# Patient Record
Sex: Male | Born: 1990 | Race: White | Hispanic: No | Marital: Single | Smoking: Current every day smoker
Health system: Southern US, Community
[De-identification: ages and names within clinical notes are randomized; demographics above are authoritative.]

---
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
## 2014-01-08 ENCOUNTER — Ambulatory Visit (INDEPENDENT_AMBULATORY_CARE_PROVIDER_SITE_OTHER): Payer: PRIVATE HEALTH INSURANCE | Admitting: Internal Medicine

## 2014-01-08 VITALS — BP 124/82 | HR 67 | Temp 98.0°F | Resp 16 | Ht 68.75 in | Wt 152.2 lb

## 2014-01-08 DIAGNOSIS — H05229 Edema of unspecified orbit: Secondary | ICD-10-CM

## 2014-01-08 DIAGNOSIS — L237 Allergic contact dermatitis due to plants, except food: Secondary | ICD-10-CM

## 2014-01-08 DIAGNOSIS — H578 Other specified disorders of eye and adnexa: Secondary | ICD-10-CM

## 2014-01-08 MED ORDER — HYDROXYZINE HCL 25 MG PO TABS: 12.5000 mg | ORAL_TABLET | Freq: Three times a day (TID) | ORAL | Status: DC | PRN

## 2014-01-08 MED ORDER — METHYLPREDNISOLONE SODIUM SUCC 125 MG IJ SOLR
62.5000 mg | Freq: Once | INTRAMUSCULAR | Status: AC
  Administered 2014-01-08: 62.5 mg via INTRAMUSCULAR

## 2014-01-08 MED ORDER — PREDNISONE 20 MG PO TABS: ORAL_TABLET | ORAL | Status: DC

## 2014-01-08 NOTE — Progress Notes (Signed)
IDENTIFYING INFORMATION  Patrick Harrington / DOB: May 06, 1990 / MRN: [id number]  The patient  does not have a problem list on file.  SUBJECTIVE  CC: Poison Ivy   HPI: Patrick Harrington is a 24 y.o. y.o. male presenting for a skin rash around his left eye that started on 3 nights ago.  He was duck hunting Saturday and reports a poison ivy exposure at that time. He reports left orbital swelling and severe pruritis at this time. He feels it is getting worse, given that he had little swelling last night. He has applied calamine lotion which helps with the itching.  He however denies eye pain, difficulty moving the eye and changes in vision. He has tried  He has a history of poison ivy allergy, to which he reports "hundreds" of exposures.  He only reports one other episode in which his eyes were involved, at which time he required a IM solumedrol along with a medrol dose pack.   He denies a history of diabetes, HTN, PUD, and gastritis.   He  has no past medical history on file.    He currently has no medications in their medication list.  Patrick Harrington has No Known Allergies. He  reports that he has been smoking.  He does not have any smokeless tobacco history on file. He reports that he drinks alcohol. He reports that he does not use illicit drugs. He  has no sexual activity history on file.  The patient  has no past surgical history on file.  His family history is not on file.  Review of Systems  Respiratory: Negative for shortness of breath and wheezing.   Cardiovascular: Negative for palpitations.  Gastrointestinal: Negative for heartburn and abdominal pain.  Genitourinary: Negative.   Musculoskeletal: Negative.   Skin: Positive for itching and rash.  Neurological: Negative for dizziness, sensory change and headaches.    OBJECTIVE  Blood pressure 124/82, pulse 67, temperature 98 F (36.7 C), temperature source Oral, resp. rate 16, height 5' 8.75" (1.746 m), weight 152 lb 3.2 oz (69.037 kg),  SpO2 99 %. The patient's body mass index is 22.65 kg/(m^2).  Physical Exam  Constitutional: He is oriented to person, place, and time. He appears well-developed and well-nourished. No distress.  HENT:  Head: Normocephalic.  Right Ear: External ear normal.  Left Ear: External ear normal.  Nose: Nose normal.  Mouth/Throat: Oropharynx is clear and moist. No oropharyngeal exudate.  Eyes: Conjunctivae and EOM are normal. Pupils are equal, round, and reactive to light.    Respiratory: Effort normal.  Neurological: He is alert and oriented to person, place, and time.  Skin: Skin is warm. Rash noted. He is not diaphoretic. There is erythema.  Psychiatric: He has a normal mood and affect.    Visual Acuity Screening   Right eye Left eye Both eyes  Without correction:     With correction: 20/20 20/25 -1 20/15 -1     No results found for this or any previous visit (from the past 24 hour(s)).  ASSESSMENT & PLAN  Patrick was seen today for poison ivy.  Diagnoses and associated orders for this visit:  Poison ivy - methylPREDNISolone sodium succinate (SOLU-MEDROL) 125 mg/2 mL injection 62.5 mg; Inject 1 mL (62.5 mg total) into the muscle once. - hydrOXYzine (ATARAX/VISTARIL) 25 MG tablet; Take 0.5-1 tablets (12.5-25 mg total) by mouth every 8 (eight) hours as needed for anxiety or itching. - predniSONE (DELTASONE) 20 MG tablet; Take 3 pills for 4  days, then 2 pills for 4 days, and 1 pill for 4 days.  (Starting tomorrow per pharm rec)  Orbital swelling - predniSONE (DELTASONE) 20 MG tablet; Take 3 pills for 4 days, then 2 pills for 4 days, and 1 pill for 4 days. (Starting tomorrow per pharm rec) -     Pt advised to call or go to the ED is his swelling becomes worse or his vision             become impaired.       The patient was instructed to to call or comeback to clinic as needed, or should symptoms warrant.  Deliah BostonMichael Clark, MHS, PA-C [facility] 01/08/2014 10:34 AM

## 2016-08-04 ENCOUNTER — Ambulatory Visit (INDEPENDENT_AMBULATORY_CARE_PROVIDER_SITE_OTHER): Payer: BLUE CROSS/BLUE SHIELD | Admitting: Internal Medicine

## 2016-08-04 ENCOUNTER — Encounter: Payer: Self-pay | Admitting: Internal Medicine

## 2016-08-04 DIAGNOSIS — M545 Low back pain, unspecified: Secondary | ICD-10-CM

## 2016-08-04 DIAGNOSIS — M25562 Pain in left knee: Secondary | ICD-10-CM | POA: Diagnosis not present

## 2016-08-04 DIAGNOSIS — M25561 Pain in right knee: Secondary | ICD-10-CM

## 2016-08-04 MED ORDER — MELOXICAM 15 MG PO TABS: 15.0000 mg | ORAL_TABLET | Freq: Every day | ORAL | 0 refills | Status: DC

## 2016-08-04 MED ORDER — CYCLOBENZAPRINE HCL 10 MG PO TABS: 10.0000 mg | ORAL_TABLET | Freq: Every day | ORAL | 0 refills | Status: DC
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
## 2016-08-05 NOTE — Patient Instructions (Signed)
Take mobile 15 mg daily, take Flexeril 10 mg one half tablet at bedtime. Apply ice to knees 20 minutes twice daily. Return in 2 weeks. Have x-rays of LS-spine and both knees.

## 2016-08-09 ENCOUNTER — Other Ambulatory Visit: Payer: Self-pay | Admitting: Internal Medicine

## 2016-08-09 ENCOUNTER — Ambulatory Visit
Admission: RE | Admit: 2016-08-09 | Discharge: 2016-08-09 | Disposition: A | Discharge status: Home / Self Care | Payer: BLUE CROSS/BLUE SHIELD | Source: Ambulatory Visit | Attending: Internal Medicine | Admitting: Internal Medicine

## 2016-08-09 DIAGNOSIS — M545 Low back pain, unspecified: Secondary | ICD-10-CM

## 2016-08-09 DIAGNOSIS — M25562 Pain in left knee: Secondary | ICD-10-CM

## 2016-08-09 DIAGNOSIS — M25561 Pain in right knee: Secondary | ICD-10-CM

## 2016-08-19 ENCOUNTER — Encounter: Payer: Self-pay | Admitting: Internal Medicine

## 2016-08-19 ENCOUNTER — Ambulatory Visit (INDEPENDENT_AMBULATORY_CARE_PROVIDER_SITE_OTHER): Payer: BLUE CROSS/BLUE SHIELD | Admitting: Internal Medicine

## 2016-08-19 DIAGNOSIS — M545 Low back pain, unspecified: Secondary | ICD-10-CM

## 2016-08-19 DIAGNOSIS — M25561 Pain in right knee: Secondary | ICD-10-CM

## 2016-08-19 DIAGNOSIS — M25562 Pain in left knee: Secondary | ICD-10-CM | POA: Diagnosis not present

## 2016-08-19 MED ORDER — CYCLOBENZAPRINE HCL 10 MG PO TABS: 10.0000 mg | ORAL_TABLET | Freq: Three times a day (TID) | ORAL | 0 refills | Status: DC | PRN
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
## 2016-08-22 NOTE — Patient Instructions (Signed)
Proceed with physical therapy for persistent low back pain and bilateral knee pain. Follow-up after physical therapy is completed

## 2016-09-22 ENCOUNTER — Ambulatory Visit: Payer: BLUE CROSS/BLUE SHIELD | Attending: Internal Medicine | Admitting: Physical Therapy

## 2016-09-22 DIAGNOSIS — M791 Myalgia, unspecified site: Secondary | ICD-10-CM

## 2016-09-22 DIAGNOSIS — M25562 Pain in left knee: Secondary | ICD-10-CM | POA: Diagnosis present

## 2016-09-22 DIAGNOSIS — M545 Low back pain, unspecified: Secondary | ICD-10-CM

## 2016-09-22 DIAGNOSIS — M25561 Pain in right knee: Secondary | ICD-10-CM | POA: Diagnosis present

## 2016-09-22 NOTE — Patient Instructions (Addendum)
Quadratus Stretch    Sit in a chair. Roll ball out and to the left to stretch right side.  You can also do this in a doorway.  Hold 30 seconds. Repeat __2-3__ times per set. Do ___1_ sets per session. Do ___2-3_ sessions per day.   Roll with ball into wall for additional myofascial release.      Trigger Point Dry Needling  . What is Trigger Point Dry Needling (DN)? o DN is a physical therapy technique used to treat muscle pain and dysfunction. Specifically, DN helps deactivate muscle trigger points (muscle knots).  o A thin filiform needle is used to penetrate the skin and stimulate the underlying trigger point. The goal is for a local twitch response (LTR) to occur and for the trigger point to relax. No medication of any kind is injected during the procedure.   . What Does Trigger Point Dry Needling Feel Like?  o The procedure feels different for each individual patient. Some patients report that they do not actually feel the needle enter the skin and overall the process is not painful. Very mild bleeding may occur. However, many patients feel a deep cramping in the muscle in which the needle was inserted. This is the local twitch response.   Marland Kitchen How Will I feel after the treatment? o Soreness is normal, and the onset of soreness may not occur for a few hours. Typically this soreness does not last longer than two days.  o Bruising is uncommon, however; ice can be used to decrease any possible bruising.  o In rare cases feeling tired or nauseous after the treatment is normal. In addition, your symptoms may get worse before they get better, this period will typically not last longer than 24 hours.   . What Can I do After My Treatment? o Increase your hydration by drinking more water for the next 24 hours. o You may place ice or heat on the areas treated that have become sore, however, do not use heat on inflamed or bruised areas. Heat often brings more relief post needling. o You can  continue your regular activities, but vigorous activity is not recommended initially after the treatment for 24 hours. o DN is best combined with other physical therapy such as strengthening, stretching, and other therapies.
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
## 2016-09-29 ENCOUNTER — Ambulatory Visit: Payer: BLUE CROSS/BLUE SHIELD | Admitting: Physical Therapy

## 2016-09-29 ENCOUNTER — Encounter: Payer: Self-pay | Admitting: Physical Therapy

## 2016-09-29 DIAGNOSIS — M545 Low back pain, unspecified: Secondary | ICD-10-CM

## 2016-09-29 DIAGNOSIS — M25562 Pain in left knee: Secondary | ICD-10-CM

## 2016-09-29 DIAGNOSIS — M25561 Pain in right knee: Secondary | ICD-10-CM

## 2016-09-29 DIAGNOSIS — M791 Myalgia, unspecified site: Secondary | ICD-10-CM

## 2016-09-29 NOTE — Therapy (Signed)
[facility] [address] [place], [place], [address] Phone: [phone]   Fax:  [phone]  Physical Therapy Treatment  Patient Details  Name: [place] M Bisson MRN: [id number] Date of Birth: 02-10-1990 Referring Provider: Dr. Marlan Palau  Encounter Date: 09/29/2016      PT End of Session - 09/29/16 1339    Visit Number 2   Number of Visits 12   Date for PT Re-Evaluation 11/03/16   Authorization Type BCBS - 30 visit limit   PT Start Time 1330   PT Stop Time 1410   PT Time Calculation (min) 40 min   Activity Tolerance Patient tolerated treatment well   Behavior During Therapy [facility] for tasks assessed/performed      History reviewed. No pertinent past medical history.  History reviewed. No pertinent surgical history.  There were no vitals filed for this visit.      Subjective Assessment - 09/29/16 1336    Subjective Patient feels like his pain toaday has been better. He has some painsince coming over here on the car ride. He feels like car rides can make it more irritaded.    Limitations Lifting;Sitting;Standing   How long can you sit comfortably? 20 min due to back   How long can you stand comfortably? 20 min due to knee pain   Diagnostic tests xrays negative   Currently in Pain? Yes   Pain Score 3    Pain Location Back   Pain Orientation Lower;Right   Pain Descriptors / Indicators Nagging   Pain Type Acute pain   Pain Onset More than a month ago   Pain Frequency Intermittent   Aggravating Factors  Sitting and driving    Pain Relieving Factors lying flat    Effect of Pain on Daily Activities difficulty driving long distances                          [facility] Adult PT Treatment/Exercise - 09/29/16 0001      Lumbar Exercises: Stretches   Active Hamstring Stretch Limitations 90/90 2x30 sec hold    Lower Trunk Rotation Limitations x10 5 sec hold    Piriformis Stretch Limitations 2x20sec hold      Lumbar Exercises: Supine   Bent Knee Raise Limitations 2x10    Bridge Limitations 2x10    Straight Leg Raises Limitations 2x10     Manual Therapy   Manual Therapy Joint mobilization;Soft tissue mobilization   Joint Mobilization PA glides to L3-L5    Soft tissue mobilization IASTMY to lumbar parasoinals                 PT Education - 09/29/16 1338    Education provided Yes   Education Details reviewed stretching   Person(s) Educated Patient   Methods Explanation;Demonstration;Handout   Comprehension Verbalized understanding;Verbal cues required;Returned demonstration             PT Long Term Goals - 09/22/16 1407      PT LONG TERM GOAL #1   Title independent with HEP   Status New   Target Date 11/03/16     PT LONG TERM GOAL #2   Title perform lumbar ROM without increase in pain for improved function   Status New   Target Date 11/03/16     PT LONG TERM GOAL #3   Title improve FOTO to < 30% limited for improved function   Status New   Target Date 11/03/16  PT LONG TERM GOAL #4   Title report ability to sit or stand for > 45 min without increase in pain for improved job function   Status New   Target Date 11/03/16               Plan - 09/29/16 1341    Clinical Impression Statement Patient tolerated treatment well. No increase in pain noted after treatment. Therapy added light core strengthening exercises and strethcing. Patient had moderate spasming with soft tissue mobilizatio.    Clinical Presentation Stable   Clinical Decision Making Low   Rehab Potential Good   PT Frequency 2x / week   PT Duration 6 weeks   PT Treatment/Interventions ADLs/Self Care Home Management;Electrical Stimulation;Cryotherapy;Iontophoresis /ml Dexamethasone;Moist Heat;Ultrasound;Traction;Neuromuscular re-education;Therapeutic exercise;Therapeutic activities;Functional mobility training;Gait training;Patient/family education;Manual techniques;Taping;Dry needling   PT  Next Visit Plan review tolerance to light core exercises; TPDN if needed   Consulted and Agree with Plan of Care Patient      Patient will benefit from skilled therapeutic intervention in order to improve the following deficits and impairments:  Pain, Decreased strength, Increased muscle spasms, Impaired flexibility, Postural dysfunction  Visit Diagnosis: Acute right-sided low back pain without sciatica  Acute pain of left knee  Acute pain of right knee  Myalgia     Problem List There are no active problems to display for this patient.   [name] PT DPT  09/29/2016, 2:50 PM  [facility] [address] [place], [place], [address] Phone: [phone]   Fax:  [phone]  Name: [place] M Baig MRN: [id number] Date of Birth: Aug 17, 1990

## 2016-10-05 ENCOUNTER — Encounter: Payer: Self-pay | Admitting: Physical Therapy

## 2016-10-05 ENCOUNTER — Ambulatory Visit: Payer: BLUE CROSS/BLUE SHIELD | Attending: Internal Medicine | Admitting: Physical Therapy

## 2016-10-05 DIAGNOSIS — M791 Myalgia, unspecified site: Secondary | ICD-10-CM | POA: Diagnosis present

## 2016-10-05 DIAGNOSIS — M25562 Pain in left knee: Secondary | ICD-10-CM

## 2016-10-05 DIAGNOSIS — M545 Low back pain, unspecified: Secondary | ICD-10-CM

## 2016-10-05 DIAGNOSIS — M25561 Pain in right knee: Secondary | ICD-10-CM | POA: Insufficient documentation

## 2016-10-05 NOTE — Therapy (Signed)
[facility] [address] [place], [place], [address] Phone: [phone]   Fax:  [phone]  Physical Therapy Treatment  Patient Details  Name: Patrick Harrington MRN: [id number] Date of Birth: 1990-03-26 Referring Provider: Dr. Marlan Palau  Encounter Date: 10/05/2016      PT End of Session - 10/05/16 1330    Visit Number 3   Number of Visits 12   Date for PT Re-Evaluation 11/03/16   Authorization Type BCBS - 30 visit limit   PT Start Time 1330   PT Stop Time 1425   PT Time Calculation (min) 55 min      History reviewed. No pertinent past medical history.  History reviewed. No pertinent surgical history.  There were no vitals filed for this visit.      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 20 min due to back   How long can you stand comfortably? 20 min due to knee pain   Diagnostic tests xrays negative   Patient Stated Goals improve pain   Currently in Pain? Yes   Pain Score 6    Pain Location Back   Pain Orientation Right;Lower   Pain Descriptors / Indicators Nagging;Sore   Pain Onset More than a month ago   Pain Frequency Constant            [facility] PT Assessment - 10/05/16 1355      Posture/Postural Control   Posture Comments Pt with elevated right pelvis, spasming in bil QL RIght >LEft                     [facility] Adult PT Treatment/Exercise - 10/05/16 1355      Self-Care   Self-Care Other Self-Care Comments   Other Self-Care Comments  educated on TDN and precautians and aftercare     Exercises   Exercises Lumbar     Lumbar Exercises: Stretches   Active Hamstring Stretch Limitations 90/90 2x30 sec hold    Lower Trunk Rotation Limitations x10 5 sec hold   Pt does tailor sitting to  stetch   Piriformis Stretch Limitations 2x 30sec hold   supine with VC and TC     Lumbar Exercises: Supine   Bent Knee Raise Limitations 2x10   bil   Bridge Limitations 2x10    Straight Leg Raises Limitations 2x10     Lumbar Exercises: Sidelying   Other Sidelying Lumbar Exercises left sidelying QL stretch for 5 minutes      Lumbar Exercises: Quadruped   Other Quadruped Lumbar Exercises mid back stretch x 3 x 30 sec     Manual Therapy   Manual Therapy Joint mobilization;Soft tissue mobilization;Myofascial release   Joint Mobilization PA glides to L3-L5    Soft tissue mobilization IASTMY to lumbar parasoinals    Myofascial Release right Quadratus Lumborum          Trigger Point Dry Needling - 10/05/16 1350    Consent Given? Yes   Education Handout Provided Yes   Muscles Treated Upper Body Quadratus Lumborum;Gluteus minimus;Gluteus maximus;Longissimus  Right TDN only twitch on QL   Muscles Treated Lower Body Gluteus minimus;Gluteus maximus   Longissimus Response Palpable increased muscle length  bil T-8 to L1 erector spinae   Gluteus Maximus Response  Twitch response elicited;Palpable increased muscle length   Gluteus Minimus Response Twitch response elicited;Palpable increased muscle length                   PT Long Term Goals - 10/05/16 1352      PT LONG TERM GOAL #1   Title independent with HEP   Baseline given initial HEP   Time 6   Period Weeks   Status On-going     PT LONG TERM GOAL #2   Title perform lumbar ROM without increase in pain for improved function   Baseline Pt able to perform exercises without pain.  INcreased work load this week exacerbated pain   Time 6   Period Weeks   Status On-going     PT LONG TERM GOAL #3   Title improve FOTO to < 30% limited for improved function   Time 6   Period Weeks   Status On-going     PT LONG TERM GOAL #4   Title report ability to sit or stand for > 45 min without increase in pain for improved job  function   Baseline stand 20 to 30 minutes   Period Weeks   Status On-going               Plan - 10/05/16 1531    Clinical Impression Statement Pt tolerated RX well.  Pt consented to TDN and was closely monitored throughtout RX.  Pt states he was able to do exercises without exacerbating pain.  Pt with palpable lengthening after TDN of Right QL and paraspinals   Rehab Potential Good   PT Frequency 2x / week   PT Duration 6 weeks   PT Treatment/Interventions ADLs/Self Care Home Management;Electrical Stimulation;Cryotherapy;Iontophoresis /ml Dexamethasone;Moist Heat;Ultrasound;Traction;Neuromuscular re-education;Therapeutic exercise;Therapeutic activities;Functional mobility training;Gait training;Patient/family education;Manual techniques;Taping;Dry needling   PT Next Visit Plan review tolerance to light core exercises; Assess TDN   PT Home Exercise Plan basic abd set and core, QL stretches, childs pose   Consulted and Agree with Plan of Care Patient      Patient will benefit from skilled therapeutic intervention in order to improve the following deficits and impairments:  Pain, Decreased strength, Increased muscle spasms, Impaired flexibility, Postural dysfunction  Visit Diagnosis: Acute right-sided low back pain without sciatica  Acute pain of left knee  Acute pain of right knee  Myalgia     Problem List There are no active problems to display for this patient.  Garen Lah, PT Certified Exercise Expert for the Aging Adult  10/05/16 3:34 PM Phone: [phone] Fax: [phone]  [facility] [address] [place], [place], [address] Phone: [phone]   Fax:  [phone]  Name: Patrick Harrington MRN: [id number] Date of Birth: [birth year]

## 2016-10-05 NOTE — Patient Instructions (Addendum)
  Side Twist, Kneeling   Kneel, buttocks on heels. Fold upper body forward from hips. Then reach to each side as far as possible, keeping chest low toward floor. Hold each position _30__ seconds. Repeat __3_ times per session. Do __2_ sessions per day.  To stretch your right quadratus lumborum muscle. PLace hands to the right before sitting on feet.  Copyright  VHI. All rights reserved.    Given handouts for Quadratus lumborum information and left sidelying quadratus stretch as well for 5-10 minutes  Trigger Point Dry Needling  . What is Trigger Point Dry Needling (DN)? o DN is a physical therapy technique used to treat muscle pain and dysfunction. Specifically, DN helps deactivate muscle trigger points (muscle knots).  o A thin filiform needle is used to penetrate the skin and stimulate the underlying trigger point. The goal is for a local twitch response (LTR) to occur and for the trigger point to relax. No medication of any kind is injected during the procedure.   . What Does Trigger Point Dry Needling Feel Like?  o The procedure feels different for each individual patient. Some patients report that they do not actually feel the needle enter the skin and overall the process is not painful. Very mild bleeding may occur. However, many patients feel a deep cramping in the muscle in which the needle was inserted. This is the local twitch response.   Marland Kitchen How Will I feel after the treatment? o Soreness is normal, and the onset of soreness may not occur for a few hours. Typically this soreness does not last longer than two days.  o Bruising is uncommon, however; ice can be used to decrease any possible bruising.  o In rare cases feeling tired or nauseous after the treatment is normal. In addition, your symptoms may get worse before they get better, this period will typically not last longer than 24 hours.   . What Can I do After My Treatment? o Increase your hydration by drinking more water for  the next 24 hours. o You may place ice or heat on the areas treated that have become sore, however, do not use heat on inflamed or bruised areas. Heat often brings more relief post needling. o You can continue your regular activities, but vigorous activity is not recommended initially after the treatment for 24 hours. o DN is best combined with other physical therapy such as strengthening, stretching, and other therapies.   Garen Lah, PT Certified Exercise Expert for the Aging Adult  10/05/16 1:42 PM Phone: [phone] Fax: [phone]

## 2016-10-07 ENCOUNTER — Encounter: Payer: BLUE CROSS/BLUE SHIELD | Admitting: Physical Therapy

## 2016-10-12 ENCOUNTER — Ambulatory Visit: Payer: BLUE CROSS/BLUE SHIELD | Admitting: Physical Therapy

## 2016-10-12 DIAGNOSIS — M545 Low back pain, unspecified: Secondary | ICD-10-CM

## 2016-10-12 DIAGNOSIS — M25562 Pain in left knee: Secondary | ICD-10-CM

## 2016-10-12 DIAGNOSIS — M25561 Pain in right knee: Secondary | ICD-10-CM

## 2016-10-12 NOTE — Therapy (Signed)
[facility] [address] [place], [place], [address] Phone: [phone]   Fax:  [phone]  Physical Therapy Treatment  Patient Details  Name: Patrick Harrington MRN: [id number] Date of Birth: 11/04/1990 Referring Provider: Dr. Marlan Palau  Encounter Date: 10/12/2016      PT End of Session - 10/12/16 1339    Visit Number 4   Number of Visits 12   Date for PT Re-Evaluation 11/03/16   Authorization Type BCBS - 30 visit limit   PT Start Time 1330   PT Stop Time 1413   PT Time Calculation (min) 43 min   Activity Tolerance Patient tolerated treatment well   Behavior During Therapy [facility] for tasks assessed/performed      No past medical history on file.  No past surgical history on file.  There were no vitals filed for this visit.      Subjective Assessment - 10/12/16 1335    Subjective Patient reported his back pain is more in the middle of his back. he flet the needling helped but made him sore. He would like to try it again on Thursday.    Limitations Lifting;Sitting;Standing   How long can you sit comfortably? 20 min due to back   How long can you stand comfortably? 20 min due to knee pain   Diagnostic tests xrays negative   Patient Stated Goals improve pain   Currently in Pain? Yes   Pain Score 4    Pain Location Back   Pain Orientation Right;Lower   Pain Descriptors / Indicators Nagging   Pain Type Acute pain   Pain Onset More than a month ago   Pain Frequency Constant   Aggravating Factors  sitting and driving    Pain Relieving Factors lying flat    Effect of Pain on Daily Activities difficulty involving distances.                         [facility] Adult PT Treatment/Exercise - 10/12/16 0001      Lumbar Exercises: Stretches   Active Hamstring Stretch Limitations 90/90 2x30 sec hold    Lower Trunk Rotation Limitations x10 5 sec hold   Pt does tailor sitting to stetch   Piriformis Stretch  Limitations 2x 30sec hold   supine with VC and TC     Lumbar Exercises: Supine   Bent Knee Raise Limitations 2x10   bil   Bridge Limitations 2x10    Straight Leg Raises Limitations 2x10     Lumbar Exercises: Quadruped   Other Quadruped Lumbar Exercises mid back stretch x 3 x 30 sec; bird dog x10 bilateral      Manual Therapy   Manual Therapy Joint mobilization;Soft tissue mobilization;Myofascial release   Joint Mobilization PA glides to L3-L5    Soft tissue mobilization IASTMY to lumbar parasoinals    Myofascial Release right Quadratus Lumborum                PT Education - 10/12/16 1337    Education provided Yes   Education Details reviewed stretchng and exercises.    Person(s) Educated Patient   Methods Explanation;Demonstration;Handout;Tactile cues   Comprehension Verbalized understanding;Returned demonstration;Verbal cues required;Tactile cues required             PT Long Term Goals - 10/05/16 1352      PT LONG TERM GOAL #1   Title independent with HEP   Baseline given initial HEP   Time 6  Period Weeks   Status On-going     PT LONG TERM GOAL #2   Title perform lumbar ROM without increase in pain for improved function   Baseline Pt able to perform exercises without pain.  INcreased work load this week exacerbated pain   Time 6   Period Weeks   Status On-going     PT LONG TERM GOAL #3   Title improve FOTO to < 30% limited for improved function   Time 6   Period Weeks   Status On-going     PT LONG TERM GOAL #4   Title report ability to sit or stand for > 45 min without increase in pain for improved job function   Baseline stand 20 to 30 minutes   Period Weeks   Status On-going               Plan - 10/12/16 1339    Clinical Impression Statement Patient had imporoved pain with treatment. His pain was more centralized today in his spine. He declined dry needling until Thursday ( closer to the weekend).    Clinical Presentation Stable    Clinical Decision Making Low   Rehab Potential Good   PT Frequency 2x / week   PT Duration 6 weeks   PT Treatment/Interventions ADLs/Self Care Home Management;Electrical Stimulation;Cryotherapy;Iontophoresis /ml Dexamethasone;Moist Heat;Ultrasound;Traction;Neuromuscular re-education;Therapeutic exercise;Therapeutic activities;Functional mobility training;Gait training;Patient/family education;Manual techniques;Taping;Dry needling   PT Next Visit Plan review tolerance to light core exercises; Assess TDN   PT Home Exercise Plan basic abd set and core, QL stretches, childs pose   Consulted and Agree with Plan of Care Patient      Patient will benefit from skilled therapeutic intervention in order to improve the following deficits and impairments:  Pain, Decreased strength, Increased muscle spasms, Impaired flexibility, Postural dysfunction  Visit Diagnosis: Acute right-sided low back pain without sciatica  Acute pain of left knee  Acute pain of right knee     Problem List There are no active problems to display for this patient.   [name]  PT DPT  10/12/2016, 4:12 PM  [facility] [address] [place], [place], [address] Phone: [phone]   Fax:  [phone]  Name: Patrick Harrington MRN: [id number] Date of Birth: Dec 07, 1990

## 2016-10-12 NOTE — Therapy (Deleted)
[facility] [address] [place], [place], [address] Phone: [phone]   Fax:  [phone]  Physical Therapy Evaluation  Patient Details  Name: Patrick Harrington MRN: [id number] Date of Birth: 18-Feb-1990 Referring Provider: Dr. Marlan Palau  Encounter Date: 10/12/2016      PT End of Session - 10/12/16 1339    Visit Number 4   Number of Visits 12   Date for PT Re-Evaluation 11/03/16   Authorization Type BCBS - 30 visit limit   PT Start Time 1330   PT Stop Time 1413   PT Time Calculation (min) 43 min   Activity Tolerance Patient tolerated treatment well   Behavior During Therapy [facility] for tasks assessed/performed      No past medical history on file.  No past surgical history on file.  There were no vitals filed for this visit.       Subjective Assessment - 10/12/16 1335    Subjective Patient reported his back pain is more in the middle of his back. he flet the needling helped but made him sore. He would like to try it again on Thursday.    Limitations Lifting;Sitting;Standing   How long can you sit comfortably? 20 min due to back   How long can you stand comfortably? 20 min due to knee pain   Diagnostic tests xrays negative   Patient Stated Goals improve pain   Currently in Pain? Yes   Pain Score 4    Pain Location Back   Pain Orientation Right;Lower   Pain Descriptors / Indicators Nagging   Pain Type Acute pain   Pain Onset More than a month ago   Pain Frequency Constant   Aggravating Factors  sitting and driving    Pain Relieving Factors lying flat    Effect of Pain on Daily Activities difficulty involving distances.                Objective measurements completed on examination: See above findings.          [facility] Adult PT Treatment/Exercise - 10/12/16 0001      Lumbar Exercises: Stretches   Active Hamstring Stretch Limitations 90/90 2x30 sec hold    Lower Trunk Rotation Limitations x10 5 sec  hold   Pt does tailor sitting to stetch   Piriformis Stretch Limitations 2x 30sec hold   supine with VC and TC     Lumbar Exercises: Supine   Bent Knee Raise Limitations 2x10   bil   Bridge Limitations 2x10    Straight Leg Raises Limitations 2x10     Lumbar Exercises: Quadruped   Other Quadruped Lumbar Exercises mid back stretch x 3 x 30 sec; bird dog x10 bilateral      Manual Therapy   Manual Therapy Joint mobilization;Soft tissue mobilization;Myofascial release   Joint Mobilization PA glides to L3-L5    Soft tissue mobilization IASTMY to lumbar parasoinals    Myofascial Release right Quadratus Lumborum                PT Education - 10/12/16 1337    Education provided Yes   Education Details reviewed stretchng and exercises.    Person(s) Educated Patient   Methods Explanation;Demonstration;Handout;Tactile cues   Comprehension Verbalized understanding;Returned demonstration;Verbal cues required;Tactile cues required             PT Long Term Goals - 10/05/16 1352      PT LONG TERM GOAL #1   Title independent with HEP  Baseline given initial HEP   Time 6   Period Weeks   Status On-going     PT LONG TERM GOAL #2   Title perform lumbar ROM without increase in pain for improved function   Baseline Pt able to perform exercises without pain.  INcreased work load this week exacerbated pain   Time 6   Period Weeks   Status On-going     PT LONG TERM GOAL #3   Title improve FOTO to < 30% limited for improved function   Time 6   Period Weeks   Status On-going     PT LONG TERM GOAL #4   Title report ability to sit or stand for > 45 min without increase in pain for improved job function   Baseline stand 20 to 30 minutes   Period Weeks   Status On-going                Plan - 10/12/16 1339    Clinical Impression Statement Patient had imporoved pain with treatment. His pain was more centralized today in his spine. He declined dry needling until  Thursday ( closer to the weekend).    Clinical Presentation Stable   Clinical Decision Making Low   Rehab Potential Good   PT Frequency 2x / week   PT Duration 6 weeks   PT Treatment/Interventions ADLs/Self Care Home Management;Electrical Stimulation;Cryotherapy;Iontophoresis /ml Dexamethasone;Moist Heat;Ultrasound;Traction;Neuromuscular re-education;Therapeutic exercise;Therapeutic activities;Functional mobility training;Gait training;Patient/family education;Manual techniques;Taping;Dry needling   PT Next Visit Plan review tolerance to light core exercises; Assess TDN   PT Home Exercise Plan basic abd set and core, QL stretches, childs pose   Consulted and Agree with Plan of Care Patient      Patient will benefit from skilled therapeutic intervention in order to improve the following deficits and impairments:  Pain, Decreased strength, Increased muscle spasms, Impaired flexibility, Postural dysfunction  Visit Diagnosis: Acute right-sided low back pain without sciatica  Acute pain of left knee  Acute pain of right knee     Problem List There are no active problems to display for this patient.   Patrick Harrington 10/12/2016, 4:10 PM  [facility] [address] [place], [place], [address] Phone: [phone]   Fax:  [phone]  Name: Patrick Harrington MRN: [id number] Date of Birth: 10-04-1990

## 2016-10-14 ENCOUNTER — Encounter: Payer: Self-pay | Admitting: Physical Therapy

## 2016-10-14 ENCOUNTER — Ambulatory Visit: Payer: BLUE CROSS/BLUE SHIELD | Admitting: Physical Therapy

## 2016-10-14 DIAGNOSIS — M545 Low back pain, unspecified: Secondary | ICD-10-CM

## 2016-10-14 DIAGNOSIS — M25561 Pain in right knee: Secondary | ICD-10-CM

## 2016-10-14 DIAGNOSIS — M25562 Pain in left knee: Secondary | ICD-10-CM

## 2016-10-15 NOTE — Therapy (Signed)
[facility] Outpatient Rehabilitation [facility] [address] [place], [place], [address] Phone: [phone]   Fax:  [phone]  Physical Therapy Treatment  Patient Details  Name: Patrick Harrington MRN: [id number] Date of Birth: 1990-04-05 Referring Provider: Dr. Marlan Palau  Encounter Date: 10/14/2016      PT End of Session - 10/14/16 1337    Visit Number 5   Number of Visits 12   Date for PT Re-Evaluation 11/03/16   Authorization Type BCBS - 30 visit limit   PT Start Time 1330   PT Stop Time 1412   PT Time Calculation (min) 42 min   Activity Tolerance Patient tolerated treatment well   Behavior During Therapy [facility] for tasks assessed/performed      History reviewed. No pertinent past medical history.  History reviewed. No pertinent surgical history.  There were no vitals filed for this visit.      Subjective Assessment - 10/14/16 1335    Subjective Patient reports his back has been a little better. his pain level today is about a 2/10.    Limitations Lifting;Sitting;Standing   How long can you sit comfortably? 20 min due to back   How long can you stand comfortably? 20 min due to knee pain   Diagnostic tests xrays negative   Patient Stated Goals improve pain   Currently in Pain? Yes   Pain Score 2    Pain Location Back   Pain Orientation Right;Lower   Pain Descriptors / Indicators Nagging   Pain Type Acute pain   Pain Onset More than a month ago   Pain Frequency Constant   Aggravating Factors  sitting and driving    Pain Relieving Factors lyin flat    Effect of Pain on Daily Activities difficulty involving distances                          [facility] Adult PT Treatment/Exercise - 10/15/16 0001      Lumbar Exercises: Stretches   Active Hamstring Stretch Limitations 90/90 2x30 sec hold    Lower Trunk Rotation Limitations x10 5 sec hold   Pt does tailor sitting to stetch   Piriformis Stretch Limitations 2x 30sec hold   supine with  VC and TC     Lumbar Exercises: Standing   Other Standing Lumbar Exercises band chop blue x10 each way ; pallof press 2x10 each way      Lumbar Exercises: Quadruped   Other Quadruped Lumbar Exercises mid back stretch x 3 x 30 sec; bird dog 2x10 bilateral      Manual Therapy   Manual Therapy Joint mobilization;Soft tissue mobilization;Myofascial release   Joint Mobilization PA glides to L3-L5 LAD to bilateral lower extermitys.    Soft tissue mobilization IASTMY to lumbar parasoinals    Myofascial Release right Quadratus Lumborum                PT Education - 10/14/16 1336    Education provided Yes   Education Details reviewed stretching exercises    Person(s) Educated Patient   Methods Explanation;Demonstration;Tactile cues;Verbal cues   Comprehension Verbalized understanding;Returned demonstration;Verbal cues required;Tactile cues required             PT Long Term Goals - 10/05/16 1352      PT LONG TERM GOAL #1   Title independent with HEP   Baseline given initial HEP   Time 6   Period Weeks   Status On-going  PT LONG TERM GOAL #2   Title perform lumbar ROM without increase in pain for improved function   Baseline Pt able to perform exercises without pain.  INcreased work load this week exacerbated pain   Time 6   Period Weeks   Status On-going     PT LONG TERM GOAL #3   Title improve FOTO to < 30% limited for improved function   Time 6   Period Weeks   Status On-going     PT LONG TERM GOAL #4   Title report ability to sit or stand for > 45 min without increase in pain for improved job function   Baseline stand 20 to 30 minutes   Period Weeks   Status On-going               Plan - 10/14/16 1338    Clinical Impression Statement Therapy added band chops and pallof press. He had no increase in pain but could feel some fatigue in his back muscles. Overall he is making good progress. He was given his exercises for his HEP    Clinical  Presentation Stable   Clinical Decision Making Low   Rehab Potential Good   PT Frequency 2x / week   PT Duration 6 weeks   PT Treatment/Interventions ADLs/Self Care Home Management;Electrical Stimulation;Cryotherapy;Iontophoresis /ml Dexamethasone;Moist Heat;Ultrasound;Traction;Neuromuscular re-education;Therapeutic exercise;Therapeutic activities;Functional mobility training;Gait training;Patient/family education;Manual techniques;Taping;Dry needling   PT Next Visit Plan review tolerance to light core exercises; Assess TDN   PT Home Exercise Plan basic abd set and core, QL stretches, childs pose   Consulted and Agree with Plan of Care Patient      Patient will benefit from skilled therapeutic intervention in order to improve the following deficits and impairments:  Pain, Decreased strength, Increased muscle spasms, Impaired flexibility, Postural dysfunction  Visit Diagnosis: Acute right-sided low back pain without sciatica  Acute pain of left knee  Acute pain of right knee     Problem List There are no active problems to display for this patient.   [name] PT DPT  10/15/2016, 8:06 AM  [facility] [address] [place], [place], [address] Phone: [phone]   Fax:  [phone]  Name: Patrick Harrington MRN: [id number] Date of Birth: June 10, 1990

## 2016-10-19 ENCOUNTER — Ambulatory Visit: Payer: BLUE CROSS/BLUE SHIELD | Admitting: Physical Therapy

## 2016-10-19 DIAGNOSIS — M791 Myalgia, unspecified site: Secondary | ICD-10-CM

## 2016-10-19 DIAGNOSIS — M545 Low back pain, unspecified: Secondary | ICD-10-CM

## 2016-10-19 DIAGNOSIS — M25562 Pain in left knee: Secondary | ICD-10-CM

## 2016-10-19 DIAGNOSIS — M25561 Pain in right knee: Secondary | ICD-10-CM

## 2016-10-19 NOTE — Therapy (Signed)
[place], [place], [address] Phone: [phone]   Fax:  [phone]  Physical Therapy Treatment  Patient Details  Name: Patrick Harrington MRN: [id number] Date of Birth: Aug 22, 1990 Referring Provider: Dr. Tedra Senegal  Encounter Date: 10/19/2016      PT End of Session - 10/19/16 1333    Visit Number 6   Number of Visits 12   Date for PT Re-Evaluation 11/03/16   Authorization Type BCBS - 30 visit limit   PT Start Time 1330   PT Stop Time 1410   PT Time Calculation (min) 40 min      No past medical history on file.  No past surgical history on file.  There were no vitals filed for this visit.      Subjective Assessment - 10/19/16 1332    Currently in Pain? Yes   Pain Score 2    Pain Location Back   Pain Orientation Lower;Mid   Pain Descriptors / Indicators Nagging                         [facility] Adult PT Treatment/Exercise - 10/19/16 0001      Lumbar Exercises: Stretches   Active Hamstring Stretch Limitations 90/90 2x30 sec hold      Lumbar Exercises: Aerobic   Stationary Bike Nustep L 6 x 6 minutes      Lumbar Exercises: Standing   Row 20 reps   Theraband Level (Row) Level 4 (Blue)   Shoulder Extension 20 reps   Theraband Level (Shoulder Extension) Level 4 (Blue)   Other Standing Lumbar Exercises band chop blue x10 each way ; pallof press 2x10 each way      Lumbar Exercises: Supine   Bent Knee Raise Limitations foot taps from table top one at a time   Bridge Limitations 2x10 , 1 set with clam    Straight Leg Raises Limitations 2x10 with opposite arm reach    Other Supine Lumbar Exercises bridge with feet on ball, hamstring curls with feet on ball and abdominal brace    Other Supine Lumbar Exercises lower trunk rotation with feet on ball      Lumbar Exercises: Quadruped   Madcat/Old Horse 5 reps   Other Quadruped Lumbar Exercises mid back stretch x 3 x 30 sec alos  lateral x 2 each ; bird dog 2x10 bilateral      Modalities   Modalities Moist Heat     Moist Heat Therapy   Number Minutes Moist Heat 15 Minutes   Moist Heat Location Lumbar Spine                     PT Long Term Goals - 10/19/16 1350      PT LONG TERM GOAL #1   Title independent with HEP   Time 6   Period Weeks   Status Achieved     PT LONG TERM GOAL #2   Title perform lumbar ROM without increase in pain for improved function   Time 6   Period Weeks   Status Unable to assess     PT LONG TERM GOAL #3   Title improve FOTO to < 30% limited for improved function   Time 6   Period Weeks   Status Unable to assess     PT LONG TERM GOAL #4   Title report ability to sit or stand for > 45 min without increase in pain for  improved job function   Baseline can stand as long as he shifts his weight side to side, pain 2-3/10, sitting 2/10 after 20 minute drive,   Time 6   Period Weeks   Status Partially Met               Plan - 10/19/16 1336    Clinical Impression Statement driving causes increased pain after 20 minutes. Standing is okay if he shifts his weight. Highest pain is 2-3/10 lately. LTG# 4 partially met. He had a little pain on right lower back after core exercises. Repeated tailor sitting mid back stretches prior to leaving.    PT Next Visit Plan core stretcing    PT Home Exercise Plan basic abd set and core, QL stretches, childs pose   Consulted and Agree with Plan of Care Patient      Patient will benefit from skilled therapeutic intervention in order to improve the following deficits and impairments:  Pain, Decreased strength, Increased muscle spasms, Impaired flexibility, Postural dysfunction  Visit Diagnosis: Acute right-sided low back pain without sciatica  Acute pain of left knee  Acute pain of right knee  Myalgia     Problem List There are no active problems to display for this patient.   Dorene Ar,  [place] 10/19/2016, 2:44 PM  [facility] [address] [place], [place], [address] Phone: [phone]   Fax:  [phone]  Name: Patrick Harrington MRN: [id number] Date of Birth: 12/18/1990

## 2016-10-21 ENCOUNTER — Encounter: Payer: Self-pay | Admitting: Physical Therapy

## 2016-10-21 ENCOUNTER — Ambulatory Visit: Payer: BLUE CROSS/BLUE SHIELD | Admitting: Physical Therapy

## 2016-10-21 DIAGNOSIS — M25562 Pain in left knee: Secondary | ICD-10-CM

## 2016-10-21 DIAGNOSIS — M545 Low back pain, unspecified: Secondary | ICD-10-CM

## 2016-10-21 DIAGNOSIS — M25561 Pain in right knee: Secondary | ICD-10-CM

## 2016-10-22 NOTE — Therapy (Addendum)
[place], [place], [address] Phone: [phone]   Fax:  [phone]  Physical Therapy Treatment  Patient Details  Name: Patrick Harrington MRN: [id number] Date of Birth: 04/14/1990 Referring Provider: Dr. Tedra Senegal  Encounter Date: 10/21/2016      PT End of Session - 10/21/16 1336    Visit Number 7   Number of Visits 12   Date for PT Re-Evaluation 11/03/16   Authorization Type BCBS - 30 visit limit   PT Start Time 1330   PT Stop Time 1415   PT Time Calculation (min) 45 min   Activity Tolerance Patient tolerated treatment well   Behavior During Therapy [facility] for tasks assessed/performed      History reviewed. No pertinent past medical history.  History reviewed. No pertinent surgical history.  There were no vitals filed for this visit.      Subjective Assessment - 10/21/16 1333    Subjective Patient reports increased pain over the past few days. He had spasming two nights ago and had difficulty slpeeing. He woke up this morning with pain going across his lower back.    Limitations Lifting;Sitting;Standing   How long can you sit comfortably? 20 min due to back   How long can you stand comfortably? 20 min due to knee pain   Diagnostic tests xrays negative   Patient Stated Goals improve pain   Currently in Pain? Yes   Pain Score 5    Pain Location Back   Pain Orientation Lower;Mid   Pain Descriptors / Indicators Nagging   Pain Type Acute pain   Pain Onset More than a month ago   Pain Frequency Constant   Aggravating Factors  sitting and driving    Pain Relieving Factors lying flat    Effect of Pain on Daily Activities difficuly walking distances                         [facility] Adult PT Treatment/Exercise - 10/22/16 0001      Lumbar Exercises: Stretches   Active Hamstring Stretch Limitations 90/90 2x30 sec hold    Lower Trunk Rotation Limitations x10 5 sec hold   Pt does  tailor sitting to stetch   Piriformis Stretch Limitations 2x 30sec hold   supine with VC and TC     Lumbar Exercises: Supine   Bridge Limitations 2x10 , 1 set with clam    Straight Leg Raises Limitations 2x10 with opposite arm reach      Modalities   Modalities Moist Heat     Moist Heat Therapy   Number Minutes Moist Heat 15 Minutes   Moist Heat Location Lumbar Spine     Manual Therapy   Manual Therapy Joint mobilization;Soft tissue mobilization;Myofascial release   Joint Mobilization PA glides to L3-L5 LAD to bilateral lower extermitys.    Soft tissue mobilization IASTMY to lumbar parasoinals    Myofascial Release right Quadratus Lumborum          Trigger Point Dry Needling - 10/22/16 0758    Consent Given? Yes   Education Handout Provided No   Longissimus Response Twitch response elicited  L4 and L5    Gluteus Maximus Response Twitch response elicited   Gluteus Minimus Response Twitch response elicited              PT Education - 10/21/16 1335    Education provided Yes   Education Details reviewed stretching exercises  Person(s) Educated Patient   Methods Explanation;Tactile cues;Demonstration;Verbal cues   Comprehension Verbalized understanding;Returned demonstration;Verbal cues required;Tactile cues required             PT Long Term Goals - 10/19/16 1350      PT LONG TERM GOAL #1   Title independent with HEP   Time 6   Period Weeks   Status Achieved     PT LONG TERM GOAL #2   Title perform lumbar ROM without increase in pain for improved function   Time 6   Period Weeks   Status Unable to assess     PT LONG TERM GOAL #3   Title improve FOTO to < 30% limited for improved function   Time 6   Period Weeks   Status Unable to assess     PT LONG TERM GOAL #4   Title report ability to sit or stand for > 45 min without increase in pain for improved job function   Baseline can stand as long as he shifts his weight side to side, pain 2-3/10,  sitting 2/10 after 20 minute drive,   Time 6   Period Weeks   Status Partially Met               Plan - 10/22/16 0752    Clinical Impression Statement Patient appears to be back tracking. He had increased painover the past few days. Therapy worked on needling mostly in the glutes. That is were a lot of his pain is. He has 2 visits next weke. If his pain is moving in the right direction we will continue otherwise he will be discharged to an HEP.    Clinical Presentation Stable   Clinical Decision Making Low   Rehab Potential Good   PT Frequency 2x / week   PT Duration 6 weeks   PT Treatment/Interventions ADLs/Self Care Home Management;Electrical Stimulation;Cryotherapy;Iontophoresis 28m/ml Dexamethasone;Moist Heat;Ultrasound;Traction;Neuromuscular re-education;Therapeutic exercise;Therapeutic activities;Functional mobility training;Gait training;Patient/family education;Manual techniques;Taping;Dry needling   PT Next Visit Plan core stretcing    PT Home Exercise Plan basic abd set and core, QL stretches, childs pose   Consulted and Agree with Plan of Care Patient      Patient will benefit from skilled therapeutic intervention in order to improve the following deficits and impairments:  Pain, Decreased strength, Increased muscle spasms, Impaired flexibility, Postural dysfunction  Visit Diagnosis: Acute right-sided low back pain without sciatica  Acute pain of left knee  Acute pain of right knee     Problem List There are no active problems to display for this patient.   DCarney LivingPT DPT  10/22/2016, 8:00 AM  [facility] [place] [address]Phone: [phone]  Fax:  [phone] Name: JMartiniqueM Halder MRN: [id number] of Birth: [birth year]

## 2016-10-26 ENCOUNTER — Ambulatory Visit: Payer: BLUE CROSS/BLUE SHIELD | Admitting: Physical Therapy

## 2016-10-26 DIAGNOSIS — M545 Low back pain, unspecified: Secondary | ICD-10-CM

## 2016-10-26 DIAGNOSIS — M25562 Pain in left knee: Secondary | ICD-10-CM

## 2016-10-26 DIAGNOSIS — M25561 Pain in right knee: Secondary | ICD-10-CM

## 2016-10-26 DIAGNOSIS — M791 Myalgia, unspecified site: Secondary | ICD-10-CM

## 2016-10-26 NOTE — Therapy (Signed)
[place], [place], [address] Phone: [phone]   Fax:  [phone]  Physical Therapy Treatment  Patient Details  Name: Patrick Harrington MRN: [id number] Date of Birth: 1990-03-03 Referring Provider: Dr. Tedra Senegal  Encounter Date: 10/26/2016      PT End of Session - 10/26/16 1331    Visit Number 8   Number of Visits 12   Date for PT Re-Evaluation 11/03/16   Authorization Type BCBS - 30 visit limit   PT Start Time 0130   PT Stop Time 0220   PT Time Calculation (min) 50 min      No past medical history on file.  No past surgical history on file.  There were no vitals filed for this visit.      Subjective Assessment - 10/26/16 1350    Subjective Trigger point dry needling did not seem to help. Still 6/10.   Currently in Pain? Yes   Pain Score 6    Pain Location Back   Pain Orientation Mid;Lower   Pain Type Acute pain                         [facility] Adult PT Treatment/Exercise - 10/26/16 0001      Lumbar Exercises: Stretches   Lower Trunk Rotation Limitations x10 5 sec hold   Pt does tailor sitting to stetch   Quadruped Mid Back Stretch Limitations  performed QL stretch in doorway for Rt side x 30 seconds x 3 and Lt side 30 sec x 3      Lumbar Exercises: Aerobic   Stationary Bike Nustep L 6 x 6 minutes      Lumbar Exercises: Machines for Strengthening   Other Lumbar Machine Exercise Seated Row and Lt pull down for transition to gym   cues to not go behind head      Lumbar Exercises: Standing   Row 20 reps   Theraband Level (Row) Level 4 (Blue)   Shoulder Extension 20 reps   Theraband Level (Shoulder Extension) Level 4 (Blue)     Lumbar Exercises: Supine   Heel Slides Limitations 2 x 10    Bridge Limitations 2x10      Lumbar Exercises: Quadruped   Other Quadruped Lumbar Exercises mid back stretch x 3 x 30 sec alos lateral x 2 each ; bird dog 2x10 bilateral , sink  stretch x4      Moist Heat Therapy   Number Minutes Moist Heat 10 Minutes   Moist Heat Location Lumbar Spine                     PT Long Term Goals - 10/19/16 1350      PT LONG TERM GOAL #1   Title independent with HEP   Time 6   Period Weeks   Status Achieved     PT LONG TERM GOAL #2   Title perform lumbar ROM without increase in pain for improved function   Time 6   Period Weeks   Status Unable to assess     PT LONG TERM GOAL #3   Title improve FOTO to < 30% limited for improved function   Time 6   Period Weeks   Status Unable to assess     PT LONG TERM GOAL #4   Title report ability to sit or stand for > 45 min without increase in pain for improved job function   Baseline  can stand as long as he shifts his weight side to side, pain 2-3/10, sitting 2/10 after 20 minute drive,   Time 6   Period Weeks   Status Partially Met               Plan - 10/26/16 1331    Clinical Impression Statement Pt reports continued pain at 6/10 and he is walking a lot at work which exacerbates his pain. We reviewed sink stretch and doorway QL stretch which he can do while working to relieve tension in lumbar spine. Reviewed row and lat pull down for transition to gym equioment. Cues to avoid behind head lat pull downs.    PT Next Visit Plan core stretcing ,  dc? FOTO    PT Home Exercise Plan basic abd set and core, QL stretches, childs pose   Consulted and Agree with Plan of Care Patient      Patient will benefit from skilled therapeutic intervention in order to improve the following deficits and impairments:  Pain, Decreased strength, Increased muscle spasms, Impaired flexibility, Postural dysfunction  Visit Diagnosis: Acute right-sided low back pain without sciatica  Acute pain of left knee  Acute pain of right knee  Myalgia     Problem List There are no active problems to display for this patient.   Dorene Ar, [place] 10/26/2016, 2:14  PM  [facility] [address] [place], [place], [address] Phone: [phone]   Fax:  [phone]  Name: Patrick Harrington MRN: [id number] Date of Birth: 29-Nov-1990

## 2016-10-28 ENCOUNTER — Ambulatory Visit: Payer: BLUE CROSS/BLUE SHIELD | Admitting: Physical Therapy

## 2016-10-28 DIAGNOSIS — M791 Myalgia, unspecified site: Secondary | ICD-10-CM

## 2016-10-28 DIAGNOSIS — M25562 Pain in left knee: Secondary | ICD-10-CM

## 2016-10-28 DIAGNOSIS — M545 Low back pain, unspecified: Secondary | ICD-10-CM

## 2016-10-28 DIAGNOSIS — M25561 Pain in right knee: Secondary | ICD-10-CM

## 2016-10-29 NOTE — Therapy (Addendum)
[place], [place], [address] Phone: [phone]   Fax:  [phone]  Physical Therapy Treatment/ Discharge   Patient Details  Name: Patrick Harrington MRN: [id number] Date of Birth: 1990/06/19 Referring Provider: Dr. Tedra Senegal   Encounter Date: 10/28/2016      PT End of Session - 10/28/16 1337    Visit Number 9   Number of Visits 12   Date for PT Re-Evaluation 11/03/16   Authorization Type BCBS - 30 visit limit   PT Start Time 1330   PT Stop Time 1412   PT Time Calculation (min) 42 min   Activity Tolerance Patient tolerated treatment well   Behavior During Therapy [facility] for tasks assessed/performed      No past medical history on file.  No past surgical history on file.  There were no vitals filed for this visit.      Subjective Assessment - 10/29/16 0933    Subjective Patient was sore yesterday he is feeling a little better today. His pain is worse in his hips when he works. He is ready to go back to the gym. Patient will be discharged to HEP. He was advised to go back to MD if pain persists and dosent continue to improve.    Limitations Lifting;Sitting;Standing   How long can you sit comfortably? 20 min due to back   How long can you stand comfortably? 20 min due to knee pain   Diagnostic tests xrays negative   Patient Stated Goals improve pain   Currently in Pain? No/denies                         [facility] Adult PT Treatment/Exercise - 10/29/16 0001      Lumbar Exercises: Stretches   Active Hamstring Stretch Limitations 90/90 2x30 sec hold    Lower Trunk Rotation Limitations x10 5 sec hold   Pt does tailor sitting to stetch   Quadruped Mid Back Stretch Limitations  performed QL stretch in doorway for Rt side x 30 seconds x 3 and Lt side 30 sec x 3      Lumbar Exercises: Aerobic   Stationary Bike Nustep L 6 x 6 minutes      Lumbar Exercises: Machines for Strengthening   Other Lumbar Machine Exercise Seated Row and Lt pull down for transition to gym   cues to not go behind head      Lumbar Exercises: Standing   Row 20 reps   Theraband Level (Row) Level 4 (Blue)   Shoulder Extension 20 reps   Theraband Level (Shoulder Extension) Level 4 (Blue)   Other Standing Lumbar Exercises band chop blue x10 each way ; pallof press 2x10 each way      Lumbar Exercises: Supine   Heel Slides Limitations 2 x 10    Bridge Limitations 2x10    Other Supine Lumbar Exercises bridge with feet on ball, hamstring curls with feet on ball and abdominal brace      Lumbar Exercises: Quadruped   Plank 4x15 sec    Other Quadruped Lumbar Exercises mid back stretch x 3 x 30 sec alos lateral x 2 each ; bird dog 2x10 bilateral , sink stretch x4                 PT Education - 10/29/16 0934    Education provided Yes   Education Details reviewed gym exercises for HEP    Person(s) Educated Patient  Methods Explanation;Demonstration;Tactile cues;Verbal cues   Comprehension Returned demonstration;Verbalized understanding;Verbal cues required;Tactile cues required             PT Long Term Goals - 10/29/16 0939      PT LONG TERM GOAL #1   Title independent with HEP   Baseline given initial HEP   Time 6   Period Weeks   Status Achieved     PT LONG TERM GOAL #2   Title perform lumbar ROM without increase in pain for improved function   Baseline Pt able to perform exercises without pain.  INcreased work load this week exacerbated pain   Time 6   Period Weeks   Status On-going     PT LONG TERM GOAL #3   Title improve FOTO to < 30% limited for improved function   Time 6   Period Weeks   Status On-going     PT LONG TERM GOAL #4   Title report ability to sit or stand for > 45 min without increase in pain for improved job function   Baseline can stand as long as he shifts his weight side to side, pain 2-3/10, sitting 2/10 after 20 minute drive,   Time 6   Period  Weeks   Status On-going               Plan - 10/29/16 0936    Clinical Impression Statement Patient will be discharged to HEP 2nd to plateau in progress. He continues to have pain when he works. Therapy reviewed gym activity with the patient. he had no increase in pain.    Clinical Presentation Stable   Clinical Decision Making Low   Rehab Potential Good   PT Frequency 2x / week   PT Duration 6 weeks   PT Treatment/Interventions ADLs/Self Care Home Management;Electrical Stimulation;Cryotherapy;Iontophoresis 16m/ml Dexamethasone;Moist Heat;Ultrasound;Traction;Neuromuscular re-education;Therapeutic exercise;Therapeutic activities;Functional mobility training;Gait training;Patient/family education;Manual techniques;Taping;Dry needling   PT Next Visit Plan core stretcing ,  dc? FOTO    PT Home Exercise Plan basic abd set and core, QL stretches, childs pose   Consulted and Agree with Plan of Care Patient      Patient will benefit from skilled therapeutic intervention in order to improve the following deficits and impairments:  Pain, Decreased strength, Increased muscle spasms, Impaired flexibility, Postural dysfunction  Visit Diagnosis: Acute right-sided low back pain without sciatica  Acute pain of right knee  Acute pain of left knee  Myalgia  PHYSICAL THERAPY DISCHARGE SUMMARY  Visits from Start of Care: 9  Current functional level related to goals / functional outcomes: Continued pain with work  Remaining deficits: Pain with work    EScientist, physiological/ Equipment: HEP  Plan: Patient agrees to discharge.  Patient goals were not met. Patient is being discharged due to lack of progress.  ?????       Problem List There are no active problems to display for this patient.   DCarney Living PT DPT  10/29/2016, 9:43 AM  [facility]un[address] [place] [address]Phone: [phone]  Fax:  [phone] Name:  Patrick Harrington MRN: [id number] of Birth: 106-20-1992

## 2018-03-24 ENCOUNTER — Ambulatory Visit: Payer: BLUE CROSS/BLUE SHIELD | Admitting: Internal Medicine

## 2018-03-24 ENCOUNTER — Other Ambulatory Visit: Payer: Self-pay

## 2018-03-24 ENCOUNTER — Encounter: Payer: Self-pay | Admitting: Internal Medicine

## 2018-03-24 DIAGNOSIS — H6692 Otitis media, unspecified, left ear: Secondary | ICD-10-CM | POA: Diagnosis not present

## 2018-03-24 DIAGNOSIS — J029 Acute pharyngitis, unspecified: Secondary | ICD-10-CM | POA: Diagnosis not present

## 2018-03-24 LAB — POCT RAPID STREP A (OFFICE): Rapid Strep A Screen: NEGATIVE

## 2018-03-24 MED ORDER — DOXYCYCLINE HYCLATE 100 MG PO TABS: 100.0000 mg | ORAL_TABLET | Freq: Two times a day (BID) | ORAL | 0 refills | Status: DC

## 2018-03-24 MED ORDER — HYDROCODONE-HOMATROPINE 5-1.5 MG/5ML PO SYRP: 5.0000 mL | ORAL_SOLUTION | Freq: Three times a day (TID) | ORAL | 0 refills | Status: DC | PRN

## 2018-03-24 NOTE — Progress Notes (Signed)
   Subjective:    Patient ID: Patrick Harrington, male    DOB: [birth year], 28 y.o.   MRN: [id number]  HPI 28 year old Male not seen here in several years. He was seen at an urgent care recently for sore throat and sinusitis symptoms treated with Augmentin. Has not improved. No fever or shaking chills. No myalgias and no travel history.  Has some maliase and fatigue. frustrtaed with not feeling better. Apparently had refill on med which did not help.    Review of Systems see above.  Has left ear pain     Objective:   Physical Exam Afebrile.  Pharynx is injected.  Rapid strep screen is negative.  Left TM is full but not red.  Neck supple without adenopathy.  Chest clear to auscultation without rales or wheezing.       Assessment & Plan:  Acute left serous otitis media  Acute pharyngitis-rapid strep screen is negative  Plan: Doxycycline 100 mg twice daily for 10 days.  Hycodan 1 teaspoon p.o. every 8 hours as needed sore throat pain or cough.  Rest and drink plenty of fluids.

## 2018-03-24 NOTE — Patient Instructions (Signed)
Doxycycline 100 mg twice daily for 10 days.  Hycodan 1 teaspoon p.o. every 8 hours as needed cough.  Rest and drink plenty of fluids. 

## 2018-04-18 ENCOUNTER — Telehealth: Payer: Self-pay

## 2018-04-18 DIAGNOSIS — H6692 Otitis media, unspecified, left ear: Secondary | ICD-10-CM

## 2018-04-18 MED ORDER — DOXYCYCLINE HYCLATE 100 MG PO TABS: 100.0000 mg | ORAL_TABLET | Freq: Two times a day (BID) | ORAL | 0 refills | Status: DC

## 2018-04-18 NOTE — Telephone Encounter (Addendum)
Patient called states he called ENT and he has some questions that he needs to ask you and also requesting a refill on his antibiotic states his throat still sore. Okay to schedule doxyme visit?    Spoke with pt by phone at 12:55 pm. He is better but not completely asymptomatic. Thought is a little itchy and his ear is intermittently stopped up. Doxycycline did help. Recommend refill Doxycycline and try Dayquil as decongestant. MJB
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
## 2019-01-16 ENCOUNTER — Telehealth: Payer: Self-pay | Admitting: Internal Medicine

## 2019-01-16 NOTE — Telephone Encounter (Signed)
Ok tomorrow about 11 am?

## 2019-01-16 NOTE — Telephone Encounter (Signed)
LVM to CB and schedule appt. 

## 2019-01-16 NOTE — Telephone Encounter (Signed)
Pt CB and could not come in until Thursday

## 2019-01-16 NOTE — Telephone Encounter (Signed)
Patrick Harrington [phone]  Patrick called to say for the last month he has been having chest pain and tightness. He is smoker. No COVID exposure, job requires testing every 4 days, its been negative each time. Would like to be seen.

## 2019-01-18 ENCOUNTER — Encounter: Payer: Self-pay | Admitting: Internal Medicine

## 2019-01-18 ENCOUNTER — Ambulatory Visit: Payer: No Typology Code available for payment source | Admitting: Internal Medicine

## 2019-01-18 ENCOUNTER — Other Ambulatory Visit: Payer: Self-pay

## 2019-01-18 VITALS — BP 100/70 | HR 84 | Ht 68.75 in | Wt 178.0 lb

## 2019-01-18 DIAGNOSIS — R079 Chest pain, unspecified: Secondary | ICD-10-CM | POA: Diagnosis not present

## 2019-01-18 DIAGNOSIS — F17200 Nicotine dependence, unspecified, uncomplicated: Secondary | ICD-10-CM | POA: Diagnosis not present

## 2019-01-18 DIAGNOSIS — Z13220 Encounter for screening for lipoid disorders: Secondary | ICD-10-CM | POA: Diagnosis not present

## 2019-01-18 DIAGNOSIS — R0789 Other chest pain: Secondary | ICD-10-CM | POA: Diagnosis not present

## 2019-01-18 MED ORDER — MELOXICAM 15 MG PO TABS: 15.0000 mg | ORAL_TABLET | Freq: Every day | ORAL | 0 refills | Status: AC

## 2019-01-19 LAB — LIPID PANEL
Cholesterol: 160 mg/dL (ref <200)
HDL: 42 mg/dL (ref >=40)
LDL Cholesterol (Calc): 101 mg/dL (calc) — ABNORMAL HIGH
Non-HDL Cholesterol (Calc): 118 mg/dL (calc) (ref <130)
Total CHOL/HDL Ratio: 3.8 (calc) (ref <5.0)
Triglycerides: 77 mg/dL (ref <150)

## 2019-02-04 NOTE — Patient Instructions (Signed)
The cause of your chest pain is likely to be chest wall pain/musculoskeletal pain.  We are treating him with an anti-inflammatory medication.  Please stop smoking.  You have family history of coronary disease.  Call if symptoms do not improve within a week  or sooner if worse

## 2019-02-04 NOTE — Progress Notes (Signed)
   Subjective:    Patient ID: Patrick Harrington, male    DOB: [birth year], 29 y.o.   MRN: [id number]  HPI 29 year old male smoker who has been having chest pain and tightness for the past month.  No Covid exposure.  Says job requires testing every 4 days which has been negative each time.  Father has had ST elevation MI in 2009 so patient is concerned.  He has had no recent labs.  Fasting lipid panel is within normal limits.  His LDL is 101.  Chest x-ray shows sinus rhythm with no acute changes.  Review of Systems Denies hemoptysis or shortness of breath.  Chest pain is in the mid sternal region.  Has been doing some heavy lifting at work.    O   Physical Exam Blood pressure 100/70 pulse 84 regular, pulse oximetry 98% weight 178 pounds BMI 26.48  Skin warm and dry.  No thyromegaly.  No JVD.  No carotid bruits.  Chest is clear to auscultation without rales or wheezing.  Respirations are normal as is effort.  He has palpable left chest wall tenderness mid sternum costochondral junction.  This is consistent with chest wall pain.       Assessment & Plan:  Chest wall pain-I do not believe he has coronary artery disease at this point in time but he has risk factors and family history and history of smoking.  He should quit smoking.  He is going to be treated with anti-inflammatory medication and call if not improving in 7 to 10 days or sooner if worse.  His oxygenation is normal and doubt he has PE.

## 2021-11-06 ENCOUNTER — Encounter: Payer: Self-pay | Admitting: Emergency Medicine

## 2021-11-06 ENCOUNTER — Ambulatory Visit
Admission: EM | Admit: 2021-11-06 | Discharge: 2021-11-06 | Disposition: A | Discharge status: Home / Self Care | Payer: No Typology Code available for payment source | Attending: Internal Medicine | Admitting: Internal Medicine

## 2021-11-06 ENCOUNTER — Ambulatory Visit (INDEPENDENT_AMBULATORY_CARE_PROVIDER_SITE_OTHER): Payer: No Typology Code available for payment source

## 2021-11-06 DIAGNOSIS — M25512 Pain in left shoulder: Secondary | ICD-10-CM

## 2021-11-06 MED ORDER — IBUPROFEN 600 MG PO TABS: 600.0000 mg | ORAL_TABLET | Freq: Four times a day (QID) | ORAL | 0 refills | Status: AC | PRN

## 2021-11-06 MED ORDER — CYCLOBENZAPRINE HCL 5 MG PO TABS: 5.0000 mg | ORAL_TABLET | Freq: Two times a day (BID) | ORAL | 0 refills | Status: AC | PRN

## 2021-11-06 NOTE — Discharge Instructions (Addendum)
I have prescribed you ibuprofen and a muscle relaxer.  Please do not take any additional ibuprofen, Advil, Aleve, meloxicam while taking this ibuprofen.  Please be advised that muscle relaxer can cause drowsiness and do not drive, operate heavy machinery, drink alcohol while taking it.  Recommend ice application as well.  Follow-up with orthopedist for further evaluation and management.

## 2021-11-06 NOTE — ED Provider Notes (Signed)
[facility]    CSN: [id number] Arrival date & time: 11/06/21  1245      History   Chief Complaint Chief Complaint  Patient presents with   Shoulder Pain    HPI Patrick Harrington is a 31 y.o. male.   Patient presents with left shoulder pain after an injury that occurred a few days ago when he was lifting a sewage pump. States he felt a pop in his shoulder.  Movement exacerbates pain.  Denies any numbness or tingling.  Patient has taken ibuprofen, meloxicam, muscle relaxer that he had leftover with minimal improvement in symptoms.   Shoulder Pain   History reviewed. No pertinent past medical history.  There are no problems to display for this patient.   History reviewed. No pertinent surgical history.     Home Medications    Prior to Admission medications   Medication Sig Start Date End Date Taking? Authorizing Provider  cyclobenzaprine (FLEXERIL) 5 MG tablet Take 1 tablet (5 mg total) by mouth 2 (two) times daily as needed for muscle spasms. 11/06/21  Yes Tinia Oravec, Hildred Alamin E, FNP  ibuprofen (ADVIL) 600 MG tablet Take 1 tablet (600 mg total) by mouth every 6 (six) hours as needed for mild pain. 11/06/21  Yes Nautika Cressey, Hildred Alamin E, FNP  meloxicam (MOBIC) 15 MG tablet Take 1 tablet (15 mg total) by mouth daily. With a meal. 01/18/19   Baxley, Cresenciano Lick, MD    Family History Family History  Problem Relation Age of Onset   Heart attack Father    Heart attack Paternal Grandfather     Social History Social History   Tobacco Use   Smoking status: Every Day   Smokeless tobacco: Never  Substance Use Topics   Alcohol use: Yes    Alcohol/week: 0.0 standard drinks of alcohol   Drug use: No     Allergies   Patient has no known allergies.   Review of Systems Review of Systems Per HPI  Physical Exam Triage Vital Signs ED Triage Vitals  Enc Vitals Group     BP 11/06/21 1417 124/75     Pulse Rate 11/06/21 1417 67     Resp 11/06/21 1417 18     Temp 11/06/21 1418  98.6 F (37 C)     Temp src --      SpO2 11/06/21 1417 98 %     Weight --      Height --      Head Circumference --      Peak Flow --      Pain Score 11/06/21 1417 5     Pain Loc --      Pain Edu? --      Excl. in [place]? --    No data found.  Updated Vital Signs BP 124/75   Pulse 67   Temp 98.6 F (37 C)   Resp 18   SpO2 98%   Visual Acuity Right Eye Distance:   Left Eye Distance:   Bilateral Distance:    Right Eye Near:   Left Eye Near:    Bilateral Near:     Physical Exam Constitutional:      General: He is not in acute distress.    Appearance: Normal appearance. He is not toxic-appearing or diaphoretic.  HENT:     Head: Normocephalic and atraumatic.  Eyes:     Extraocular Movements: Extraocular movements intact.     Conjunctiva/sclera: Conjunctivae normal.  Pulmonary:     Effort: Pulmonary effort  is normal.  Musculoskeletal:     Comments: Tenderness to palpation to posterior shoulder.  No obvious swelling, discoloration, crepitus, warmth, lacerations, abrasions noted.  Patient has pain with abduction with arm directly above head.  Grip strength is 5/5.  Neurovascular intact.  Neurological:     General: No focal deficit present.     Mental Status: He is alert and oriented to person, place, and time. Mental status is at baseline.  Psychiatric:        Mood and Affect: Mood normal.        Behavior: Behavior normal.        Thought Content: Thought content normal.        Judgment: Judgment normal.      UC Treatments / Results  Labs (all labs ordered are listed, but only abnormal results are displayed) Labs Reviewed - No data to display  EKG   Radiology No results found.  Procedures Procedures (including critical care time)  Medications Ordered in UC Medications - No data to display  Initial Impression / Assessment and Plan / UC Course  I have reviewed the triage vital signs and the nursing notes.  Pertinent labs & imaging results that were  available during my care of the patient were reviewed by me and considered in my medical decision making (see chart for details).     Left shoulder x-ray was negative for any acute bony abnormality.  Suspect muscular strain/injury.  Low suspicion that anything is torn.  Recommend ice application and supportive care.  Prescribed patient ibuprofen to take as needed.  Advised patient to not take any additional NSAIDs with taking this ibuprofen.  Patient also requesting additional muscle relaxer prescription as he reports that he took his last pill that was leftover from previous prescription yesterday and he reports it has been helpful.  This was prescribed for patient and advised patient that it can cause drowsiness and do not drive or drink alcohol with taking this.  Patient to follow-up with provided contact information for orthopedist if pain persists or worsens.  Patient verbalized understanding and was agreeable plan. Final Clinical Impressions(s) / UC Diagnoses   Final diagnoses:  Acute pain of left shoulder     Discharge Instructions      I have prescribed you ibuprofen and a muscle relaxer.  Please do not take any additional ibuprofen, Advil, Aleve, meloxicam while taking this ibuprofen.  Please be advised that muscle relaxer can cause drowsiness and do not drive, operate heavy machinery, drink alcohol while taking it.  Recommend ice application as well.  Follow-up with orthopedist for further evaluation and management.     ED Prescriptions     Medication Sig Dispense Auth. Provider   ibuprofen (ADVIL) 600 MG tablet Take 1 tablet (600 mg total) by mouth every 6 (six) hours as needed for mild pain. 30 tablet [place], [place] E, [place]   cyclobenzaprine (FLEXERIL) 5 MG tablet Take 1 tablet (5 mg total) by mouth 2 (two) times daily as needed for muscle spasms. 20 tablet [place], Michele Rockers, [place]      PDMP not reviewed this encounter.   Teodora Medici, Rose Bud 11/06/21 [phone]

## 2021-11-06 NOTE — ED Triage Notes (Signed)
Pt is present today with left shoulder pain. Pt state that he was lifting a sewage pump and felt a pop in his shoulder. Pt states that he felt a dull pain afterwards.
